# Patient Record
Sex: Female | Born: 1985 | Race: Black or African American | Hispanic: No | Marital: Single | Smoking: Never smoker
Health system: Southern US, Community
[De-identification: ages and names within clinical notes are randomized; demographics above are authoritative.]

## PROBLEM LIST (undated history)

## (undated) HISTORY — PX: HERNIA REPAIR: SHX51

---
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
## 2018-04-04 ENCOUNTER — Telehealth: Payer: Self-pay

## 2018-04-04 ENCOUNTER — Emergency Department
Admission: EM | Admit: 2018-04-04 | Discharge: 2018-04-04 | Disposition: A | Discharge status: Home / Self Care | Payer: 59 | Source: Home / Self Care | Attending: Family Medicine | Admitting: Family Medicine

## 2018-04-04 ENCOUNTER — Emergency Department (INDEPENDENT_AMBULATORY_CARE_PROVIDER_SITE_OTHER): Payer: 59

## 2018-04-04 ENCOUNTER — Other Ambulatory Visit: Payer: Self-pay

## 2018-04-04 DIAGNOSIS — R109 Unspecified abdominal pain: Secondary | ICD-10-CM

## 2018-04-04 DIAGNOSIS — K5900 Constipation, unspecified: Secondary | ICD-10-CM | POA: Diagnosis not present

## 2018-04-04 DIAGNOSIS — R350 Frequency of micturition: Secondary | ICD-10-CM

## 2018-04-04 LAB — POCT URINALYSIS DIP (MANUAL ENTRY)
Glucose, UA: NEGATIVE mg/dL
Ketones, POC UA: NEGATIVE mg/dL — AB
Leukocytes, UA: NEGATIVE
Nitrite, UA: NEGATIVE
Protein Ur, POC: 30 mg/dL — AB
Spec Grav, UA: >=1.03 — AB (ref 1.010–1.025)
Urobilinogen, UA: 1 E.U./dL
pH, UA: 5.5 (ref 5.0–8.0)

## 2018-04-04 NOTE — ED Notes (Signed)
Pt to xray for [place]

## 2018-04-04 NOTE — ED Triage Notes (Signed)
About 2 weeks ago felt urinary frequency, hydrated well, and went away.  Saturday had "excrutiating" back and left side pain.  Has had frequency and burning.

## 2018-04-04 NOTE — Discharge Instructions (Signed)
°  You may take 500mg  acetaminophen every 4-6 hours or in combination with ibuprofen 400-600mg  every 6-8 hours as needed for pain.  Be sure to drink at least eight 8oz glasses of water to stay well hydrated.  Please follow up with family medicine in 1 week if not improving.

## 2018-04-04 NOTE — ED Provider Notes (Signed)
[facility]    CSN: [id number] Arrival date & time: 04/04/18  0814     History   Chief Complaint Chief Complaint  Patient presents with  . Urinary Frequency    HPI Alice Garcia is a 32 y.o. female.   HPI  Alice Garcia is a 32 y.o. female presenting to UC with c/o urinary frequency that started 2 weeks ago.  That went away after she hydrated well.  Four days ago she developed "excrutiating" Left sided back pain that has slowly improved but occasionally radiates into LLQ of abdomen and urinary frequency returned.  Denies fever, chills, n/v/d. Denies hx of kidney stones but her mother has had kidney stones.  She took Azo 4 days ago with mild relief.    History reviewed. No pertinent past medical history.  There are no active problems to display for this patient.   Past Surgical History:  Procedure Laterality Date  . HERNIA REPAIR      OB History   None      Home Medications    Prior to Admission medications   Not on File    Family History History reviewed. No pertinent family history.  Social History Social History   Tobacco Use  . Smoking status: Never Smoker  . Smokeless tobacco: Never Used  Substance Use Topics  . Alcohol use: Not Currently  . Drug use: Not Currently     Allergies   Patient has no known allergies.   Review of Systems Review of Systems  Constitutional: Negative for chills and fever.  Gastrointestinal: Positive for abdominal pain. Negative for diarrhea, nausea and vomiting.  Genitourinary: Positive for flank pain (Left) and frequency. Negative for decreased urine volume, dysuria, hematuria, pelvic pain and urgency.  Musculoskeletal: Positive for back pain. Negative for myalgias.     Physical Exam Triage Vital Signs ED Triage Vitals  Enc Vitals Group     BP 04/04/18 0843 116/81     Pulse Rate 04/04/18 0843 81     Resp --      Temp 04/04/18 0843 (!) 97.5 F (36.4 C)     Temp Source 04/04/18 0843 Oral   SpO2 04/04/18 0843 99 %     Weight 04/04/18 0846 144 lb (65.3 kg)     Height 04/04/18 0846 5\' 9"  (1.753 m)     Head Circumference --      Peak Flow --      Pain Score 04/04/18 0845 5     Pain Loc --      Pain Edu? --      Excl. in GC? --    No data found.  Updated Vital Signs BP 116/81 (BP Location: Right Arm)   Pulse 81   Temp (!) 97.5 F (36.4 C) (Oral)   Ht 5\' 9"  (1.753 m)   Wt 144 lb (65.3 kg)   LMP 03/20/2018   SpO2 99%   BMI 21.27 kg/m   Visual Acuity Right Eye Distance:   Left Eye Distance:   Bilateral Distance:    Right Eye Near:   Left Eye Near:    Bilateral Near:     Physical Exam  Constitutional: She is oriented to person, place, and time. She appears well-developed and well-nourished. No distress.  HENT:  Head: Normocephalic and atraumatic.  Mouth/Throat: Oropharynx is clear and moist.  Eyes: EOM are normal.  Neck: Normal range of motion.  Cardiovascular: Normal rate and regular rhythm.  Pulmonary/Chest: Effort normal and breath sounds normal. No stridor.  No respiratory distress. She has no wheezes. She has no rales.  Abdominal: Soft. She exhibits no distension. There is no tenderness. There is no CVA tenderness.  Musculoskeletal: Normal range of motion.  Neurological: She is alert and oriented to person, place, and time.  Skin: Skin is warm and dry. She is not diaphoretic.  Psychiatric: She has a normal mood and affect. Her behavior is normal.  Nursing note and vitals reviewed.    UC Treatments / Results  Labs (all labs ordered are listed, but only abnormal results are displayed) Labs Reviewed  POCT URINALYSIS DIP (MANUAL ENTRY) - Abnormal; Notable for the following components:      Result Value   Bilirubin, UA small (*)    Ketones, POC UA negative (*)    Spec Grav, UA >=1.030 (*)    Blood, UA trace-lysed (*)    Protein Ur, POC =30 (*)    All other components within normal limits    EKG None  Radiology [place] Renal  Result Date:  04/04/2018 CLINICAL DATA:  LEFT flank pain for 2 weeks with constipation. EXAM: RENAL / URINARY TRACT ULTRASOUND COMPLETE COMPARISON:  None. FINDINGS: Right Kidney: Length: 9.6 cm. Echogenicity within normal limits. No mass or hydronephrosis visualized. Left Kidney: Length: 8.1 cm. Some portions of the LEFT kidney are obscured by overlying stool. Echogenicity within normal limits where seen. No mass or hydronephrosis visualized. Bladder: Decompressed limiting characterization. IMPRESSION: 1. Normal RIGHT kidney. 2. LEFT kidney partially obscured by overlying stool. LEFT kidney appears normal where seen. 3. Bladder is decompressed. Electronically Signed   By: Bary RichardStan  Maynard M.D.   On: 04/04/2018 09:59    Procedures Procedures (including critical care time)  Medications Ordered in UC Medications - No data to display  Initial Impression / Assessment and Plan / UC Course  I have reviewed the triage vital signs and the nursing notes.  Pertinent labs & imaging results that were available during my care of the patient were reviewed by me and considered in my medical decision making (see chart for details).     UA not c/w definite UTI Culture sent U/S renal: no evidence of renal stone.  Pt may have passed a small stone Saturday when pain was "excrutiating"  Stool also noted on U/S. Could be causing some of the discomfort Home care instructions provided below.   Final Clinical Impressions(s) / UC Diagnoses   Final diagnoses:  Urinary frequency  Left flank pain     Discharge Instructions      You may take 500mg  acetaminophen every 4-6 hours or in combination with ibuprofen 400-600mg  every 6-8 hours as needed for pain.  Be sure to drink at least eight 8oz glasses of water to stay well hydrated.  Please follow up with family medicine in 1 week if not improving.     ED Prescriptions    None     Controlled Substance Prescriptions [place] Controlled Substance Registry consulted? Not  Applicable   Rolla Platehelps, Prajwal Fellner O, PA-C 04/04/18 1045

## 2018-04-05 LAB — URINE CULTURE
MICRO NUMBER:: 90671774
Result:: NO GROWTH
SPECIMEN QUALITY:: ADEQUATE

## 2018-04-06 ENCOUNTER — Telehealth: Payer: Self-pay | Admitting: *Deleted

## 2018-04-06 NOTE — Telephone Encounter (Signed)
LM with UCx results and to call back if she has any questions or concerns. 

## 2018-08-31 IMAGING — US US RENAL
1 series · 14 of 25 positions shown · non-contrast
Comparison: None.

CLINICAL DATA: LEFT flank pain for 2 weeks with constipation.

EXAM:
RENAL / URINARY TRACT ULTRASOUND COMPLETE

[Series 1: us renal · 0.17mm/px · 14 of 27 slices shown]
[im 1/27]
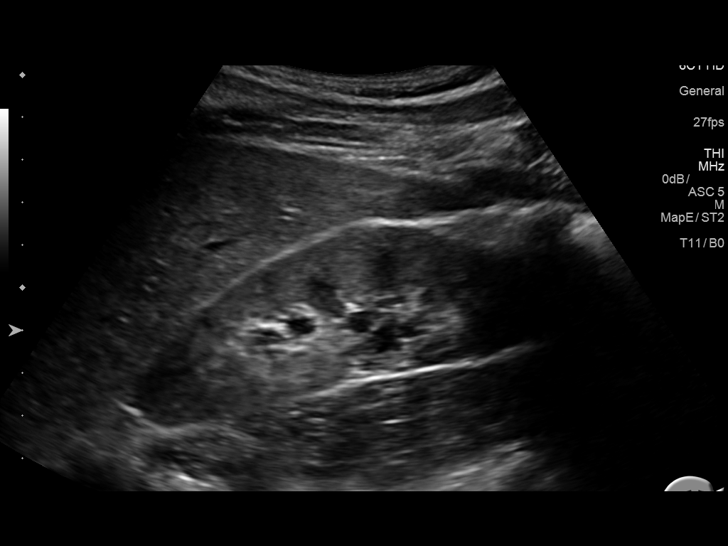
[im 3/27]
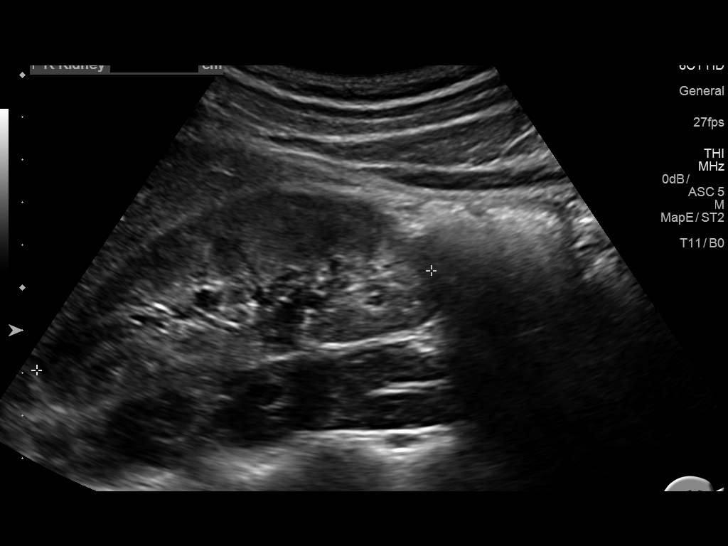
[im 5/27]
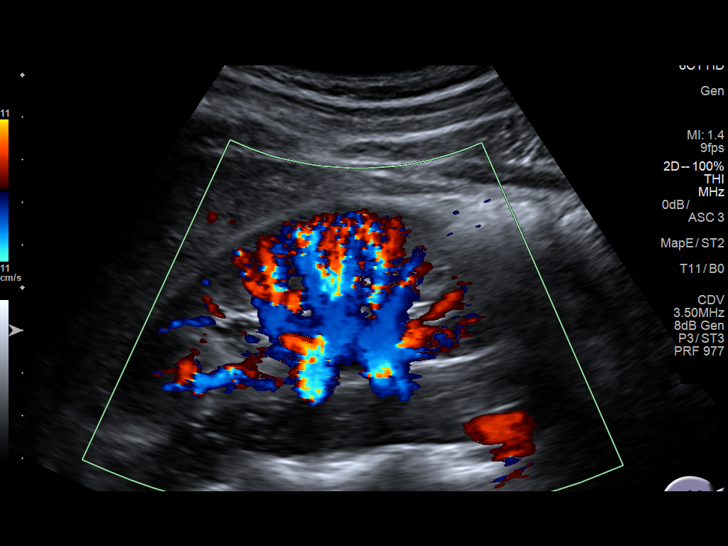
[im 7/27]
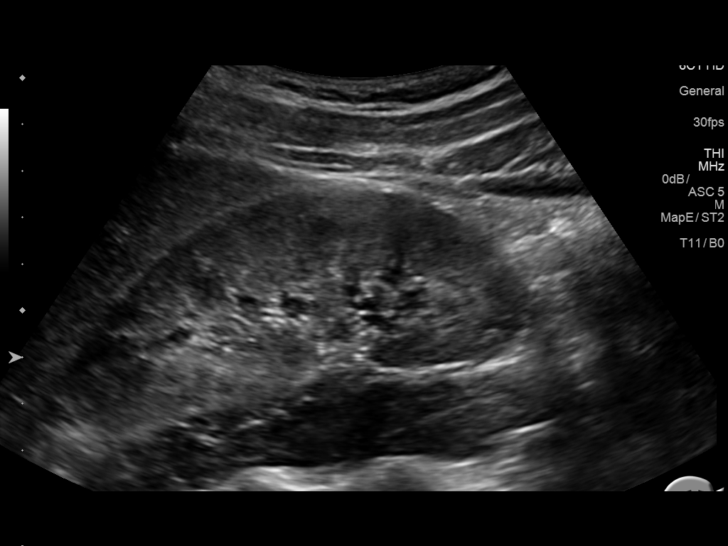
[im 9/27]
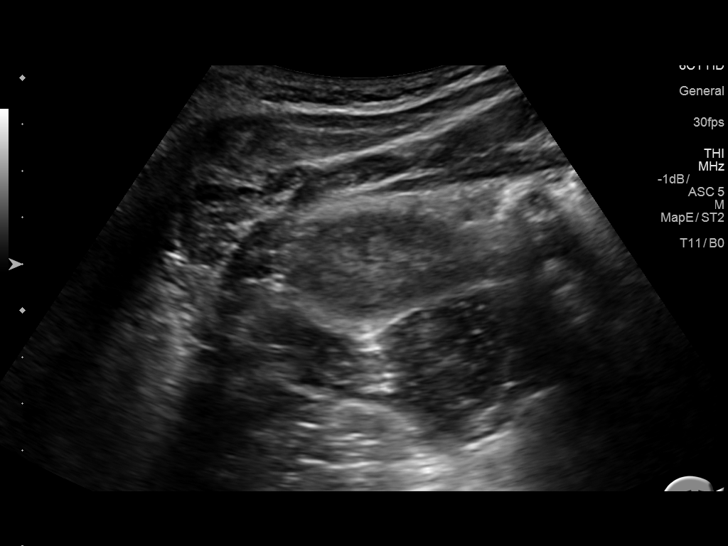
[im 10/27]
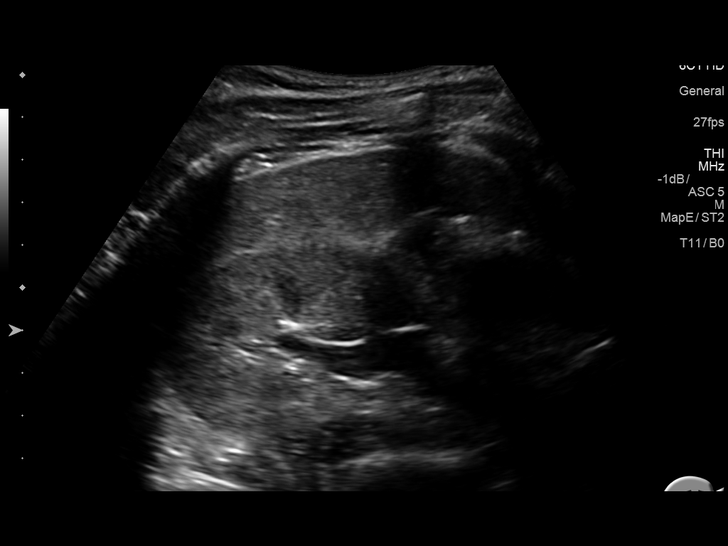
[im 12/27]
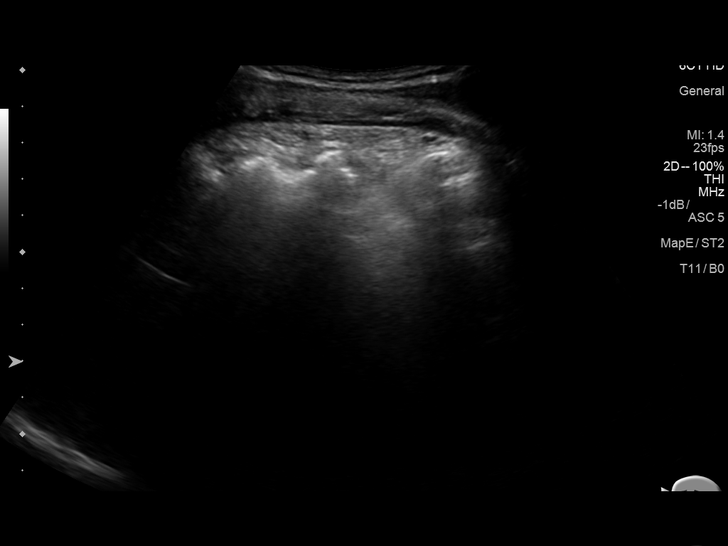
[im 15/27]
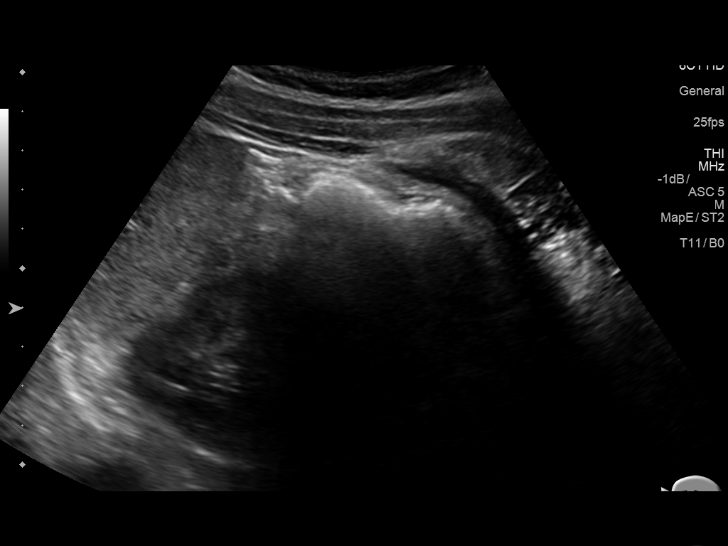
[im 17/27]
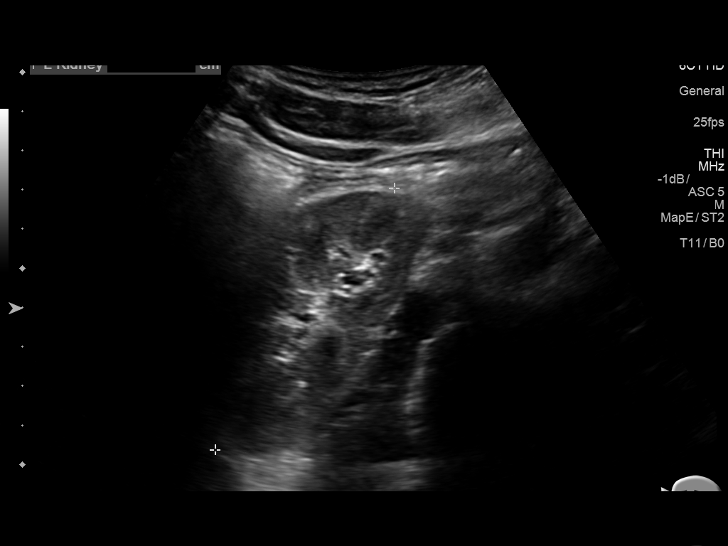
[im 18/27]
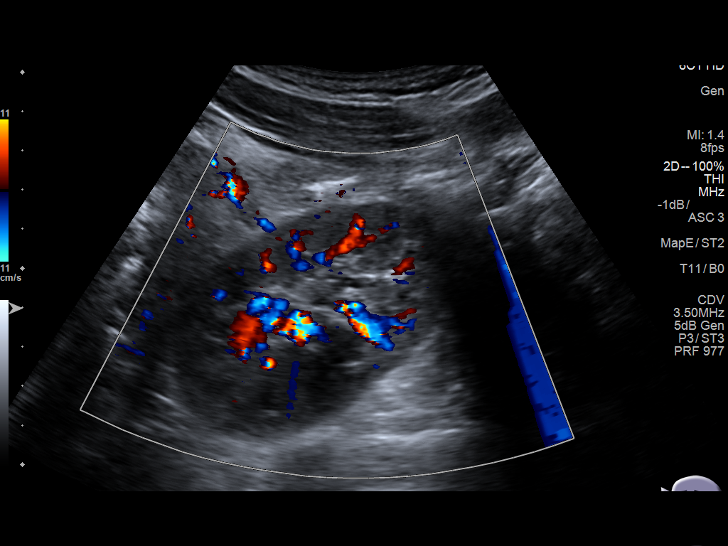
[im 20/27]
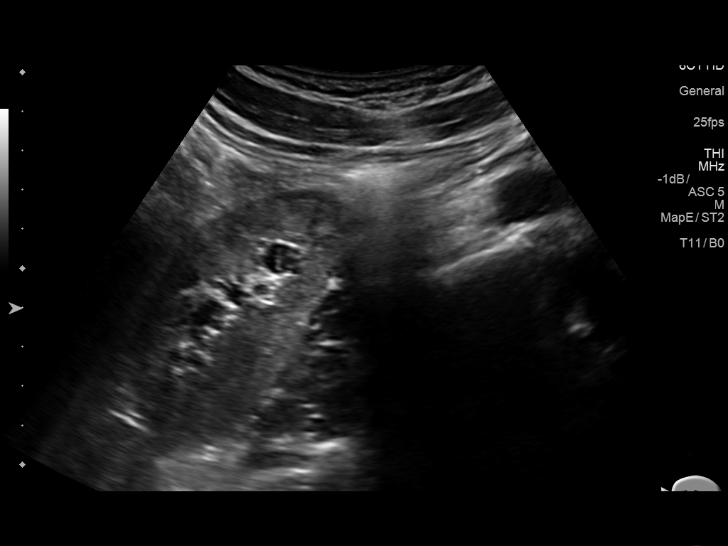
[im 22/27]
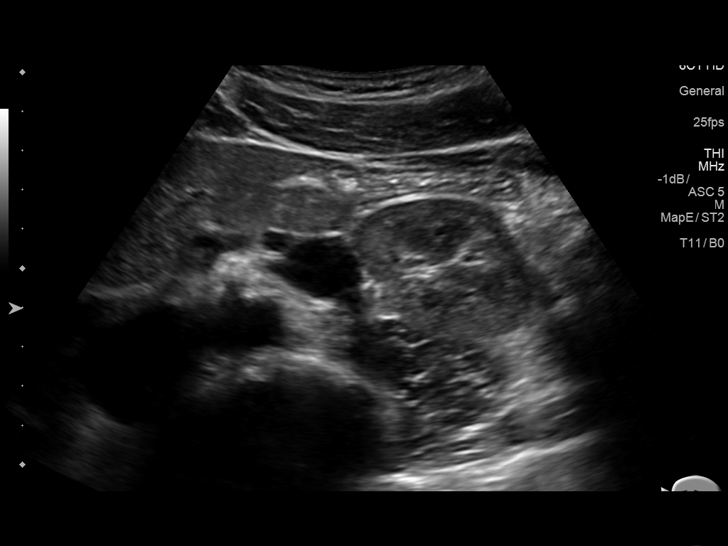
[im 24/27]
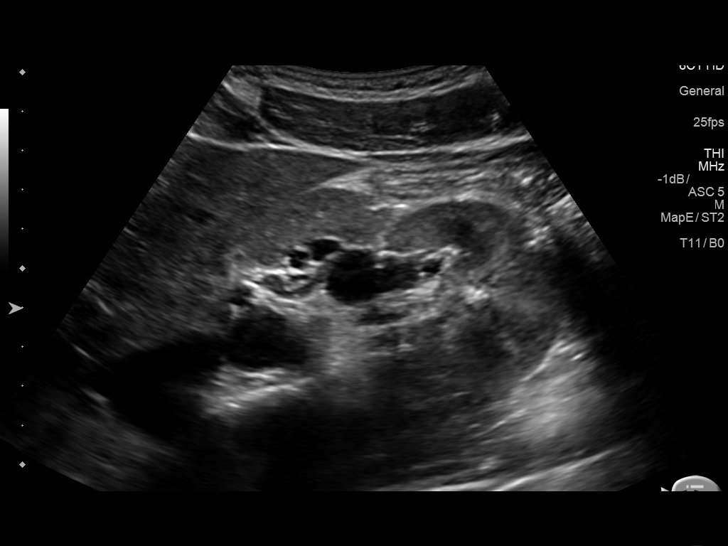
[im 27/27]
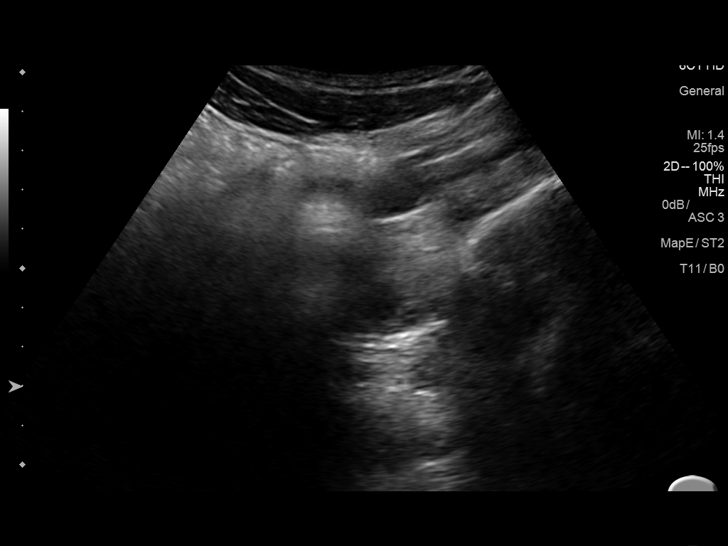

[14 of 25 positions shown; findings below may reference images not displayed]

FINDINGS: Right Kidney:

Length: 9.6 cm. Echogenicity within normal limits. No mass or
hydronephrosis visualized.

Left Kidney:

Length: 8.1 cm. Some portions of the LEFT kidney are obscured by
overlying stool. Echogenicity within normal limits where seen. No
mass or hydronephrosis visualized.

Bladder:

Decompressed limiting characterization.
IMPRESSION: 1. Normal RIGHT kidney.
2. LEFT kidney partially obscured by overlying stool. LEFT kidney
appears normal where seen.
3. Bladder is decompressed.
